# Patient Record
Sex: Male | Born: 1980 | Hispanic: Yes | Marital: Single
Health system: Southern US, Community
[De-identification: ages and names within clinical notes are randomized; demographics above are authoritative.]

---

## 2021-02-26 ENCOUNTER — Emergency Department (HOSPITAL_COMMUNITY): Payer: No Typology Code available for payment source

## 2021-02-26 ENCOUNTER — Encounter (HOSPITAL_COMMUNITY): Payer: Self-pay | Admitting: *Deleted

## 2021-02-26 ENCOUNTER — Emergency Department (HOSPITAL_COMMUNITY)
Admission: EM | Admit: 2021-02-26 | Discharge: 2021-02-27 | Disposition: A | Discharge status: Home / Self Care | Payer: No Typology Code available for payment source | Attending: Emergency Medicine | Admitting: Emergency Medicine

## 2021-02-26 DIAGNOSIS — S60222A Contusion of left hand, initial encounter: Secondary | ICD-10-CM | POA: Insufficient documentation

## 2021-02-26 DIAGNOSIS — Y92410 Unspecified street and highway as the place of occurrence of the external cause: Secondary | ICD-10-CM | POA: Insufficient documentation

## 2021-02-26 DIAGNOSIS — S8012XA Contusion of left lower leg, initial encounter: Secondary | ICD-10-CM | POA: Diagnosis not present

## 2021-02-26 DIAGNOSIS — S40212A Abrasion of left shoulder, initial encounter: Secondary | ICD-10-CM | POA: Diagnosis not present

## 2021-02-26 DIAGNOSIS — S60221A Contusion of right hand, initial encounter: Secondary | ICD-10-CM | POA: Diagnosis not present

## 2021-02-26 DIAGNOSIS — S301XXA Contusion of abdominal wall, initial encounter: Secondary | ICD-10-CM | POA: Insufficient documentation

## 2021-02-26 DIAGNOSIS — S6991XA Unspecified injury of right wrist, hand and finger(s), initial encounter: Secondary | ICD-10-CM | POA: Diagnosis present

## 2021-02-26 LAB — CBC
HCT: 46.2 % (ref 39.0–52.0)
Hemoglobin: 15.4 g/dL (ref 13.0–17.0)
MCH: 32.1 pg (ref 26.0–34.0)
MCHC: 33.3 g/dL (ref 30.0–36.0)
MCV: 96.3 fL (ref 80.0–100.0)
Platelets: 300 10*3/uL (ref 150–400)
RBC: 4.8 MIL/uL (ref 4.22–5.81)
RDW: 12.7 % (ref 11.5–15.5)
WBC: 10.7 10*3/uL — ABNORMAL HIGH (ref 4.0–10.5)
nRBC: 0 % (ref 0.0–0.2)

## 2021-02-26 LAB — COMPREHENSIVE METABOLIC PANEL
ALT: 31 U/L (ref 0–44)
AST: 35 U/L (ref 15–41)
Albumin: 4.4 g/dL (ref 3.5–5.0)
Alkaline Phosphatase: 133 U/L — ABNORMAL HIGH (ref 38–126)
Anion gap: 9 (ref 5–15)
BUN: 18 mg/dL (ref 6–20)
CO2: 25 mmol/L (ref 22–32)
Calcium: 9.5 mg/dL (ref 8.9–10.3)
Chloride: 105 mmol/L (ref 98–111)
Creatinine, Ser: 0.9 mg/dL (ref 0.61–1.24)
GFR, Estimated: >60 mL/min (ref >60)
Glucose, Bld: 116 mg/dL — ABNORMAL HIGH (ref 70–99)
Potassium: 4 mmol/L (ref 3.5–5.1)
Sodium: 139 mmol/L (ref 135–145)
Total Bilirubin: 0.8 mg/dL (ref 0.3–1.2)
Total Protein: 8.4 g/dL — ABNORMAL HIGH (ref 6.5–8.1)

## 2021-02-26 MED ORDER — IBUPROFEN 800 MG PO TABS
800.0000 mg | ORAL_TABLET | Freq: Once | ORAL | Status: AC
  Administered 2021-02-26: 800 mg via ORAL
  Filled 2021-02-26: qty 1

## 2021-02-26 MED ORDER — METHOCARBAMOL 500 MG PO TABS: 500.0000 mg | ORAL_TABLET | Freq: Two times a day (BID) | ORAL | 0 refills | Status: AC

## 2021-02-26 MED ORDER — IOHEXOL 350 MG/ML SOLN
80.0000 mL | Freq: Once | INTRAVENOUS | Status: AC | PRN
  Administered 2021-02-26: 80 mL via INTRAVENOUS

## 2021-02-26 MED ORDER — NAPROXEN 375 MG PO TABS: 375.0000 mg | ORAL_TABLET | Freq: Two times a day (BID) | ORAL | 0 refills | Status: AC
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
## 2021-02-26 NOTE — Discharge Instructions (Addendum)
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
